# Patient Record
Sex: Female | Born: 1940 | Race: White | Hispanic: No | Marital: Married | Smoking: Never smoker
Health system: Southern US, Community
[De-identification: ages and names within clinical notes are randomized; demographics above are authoritative.]

## PROBLEM LIST (undated history)

## (undated) DIAGNOSIS — I10 Essential (primary) hypertension: Secondary | ICD-10-CM

## (undated) DIAGNOSIS — C801 Malignant (primary) neoplasm, unspecified: Secondary | ICD-10-CM

## (undated) DIAGNOSIS — E119 Type 2 diabetes mellitus without complications: Secondary | ICD-10-CM

---

## 2003-12-06 ENCOUNTER — Other Ambulatory Visit: Payer: Self-pay

## 2004-04-15 ENCOUNTER — Other Ambulatory Visit: Payer: Self-pay

## 2006-08-13 ENCOUNTER — Ambulatory Visit: Payer: Self-pay | Admitting: Obstetrics and Gynecology

## 2006-08-13 ENCOUNTER — Other Ambulatory Visit: Payer: Self-pay

## 2007-09-23 ENCOUNTER — Ambulatory Visit: Payer: Self-pay | Admitting: Obstetrics and Gynecology

## 2007-09-23 ENCOUNTER — Other Ambulatory Visit: Payer: Self-pay

## 2007-09-30 ENCOUNTER — Ambulatory Visit: Payer: Self-pay | Admitting: Obstetrics and Gynecology

## 2009-12-22 ENCOUNTER — Ambulatory Visit: Payer: Self-pay | Admitting: Specialist

## 2010-01-02 ENCOUNTER — Ambulatory Visit: Payer: Self-pay | Admitting: Specialist

## 2014-06-25 ENCOUNTER — Ambulatory Visit: Payer: Self-pay | Admitting: Physician Assistant

## 2014-06-25 ENCOUNTER — Ambulatory Visit: Payer: Self-pay | Admitting: Emergency Medicine

## 2014-06-25 LAB — URINALYSIS, COMPLETE

## 2014-06-26 LAB — URINE CULTURE

## 2014-07-11 ENCOUNTER — Ambulatory Visit: Payer: Self-pay | Admitting: Urology

## 2014-07-25 ENCOUNTER — Ambulatory Visit: Payer: Self-pay | Admitting: Urology

## 2015-07-23 ENCOUNTER — Other Ambulatory Visit: Payer: Self-pay | Admitting: Family Medicine

## 2015-07-23 DIAGNOSIS — Z1231 Encounter for screening mammogram for malignant neoplasm of breast: Secondary | ICD-10-CM

## 2015-08-14 ENCOUNTER — Ambulatory Visit: Payer: Medicare Other | Attending: Family Medicine

## 2016-11-12 ENCOUNTER — Other Ambulatory Visit: Payer: Self-pay | Admitting: Family Medicine

## 2016-11-12 DIAGNOSIS — Z1231 Encounter for screening mammogram for malignant neoplasm of breast: Secondary | ICD-10-CM

## 2016-11-18 ENCOUNTER — Encounter: Payer: Self-pay | Admitting: Radiology

## 2016-11-18 ENCOUNTER — Ambulatory Visit
Admission: RE | Admit: 2016-11-18 | Discharge: 2016-11-18 | Disposition: A | Discharge status: Home / Self Care | Payer: Medicare Other | Source: Ambulatory Visit | Attending: Family Medicine | Admitting: Family Medicine

## 2016-11-18 DIAGNOSIS — Z1231 Encounter for screening mammogram for malignant neoplasm of breast: Secondary | ICD-10-CM | POA: Diagnosis present

## 2016-11-18 HISTORY — DX: Malignant (primary) neoplasm, unspecified: C80.1

## 2019-08-21 ENCOUNTER — Other Ambulatory Visit: Payer: Self-pay

## 2019-08-21 ENCOUNTER — Emergency Department: Payer: Medicare PPO

## 2019-08-21 ENCOUNTER — Encounter: Payer: Self-pay | Admitting: Emergency Medicine

## 2019-08-21 ENCOUNTER — Emergency Department
Admission: EM | Admit: 2019-08-21 | Discharge: 2019-08-21 | Disposition: A | Discharge status: Home / Self Care | Payer: Medicare PPO | Attending: Emergency Medicine | Admitting: Emergency Medicine

## 2019-08-21 DIAGNOSIS — R531 Weakness: Secondary | ICD-10-CM

## 2019-08-21 DIAGNOSIS — U071 COVID-19: Secondary | ICD-10-CM | POA: Insufficient documentation

## 2019-08-21 DIAGNOSIS — Z85828 Personal history of other malignant neoplasm of skin: Secondary | ICD-10-CM | POA: Insufficient documentation

## 2019-08-21 LAB — CBC WITH DIFFERENTIAL/PLATELET
Abs Immature Granulocytes: 0.03 10*3/uL (ref 0.00–0.07)
Basophils Absolute: 0 10*3/uL (ref 0.0–0.1)
Basophils Relative: 0 %
Eosinophils Absolute: 0 10*3/uL (ref 0.0–0.5)
Eosinophils Relative: 1 %
HCT: 39.7 % (ref 36.0–46.0)
Hemoglobin: 12.8 g/dL (ref 12.0–15.0)
Immature Granulocytes: 1 %
Lymphocytes Relative: 16 %
Lymphs Abs: 1 10*3/uL (ref 0.7–4.0)
MCH: 27.9 pg (ref 26.0–34.0)
MCHC: 32.2 g/dL (ref 30.0–36.0)
MCV: 86.5 fL (ref 80.0–100.0)
Monocytes Absolute: 0.4 10*3/uL (ref 0.1–1.0)
Monocytes Relative: 7 %
Neutro Abs: 5 10*3/uL (ref 1.7–7.7)
Neutrophils Relative %: 75 %
Platelets: 185 10*3/uL (ref 150–400)
RBC: 4.59 MIL/uL (ref 3.87–5.11)
RDW: 12.5 % (ref 11.5–15.5)
WBC: 6.5 10*3/uL (ref 4.0–10.5)
nRBC: 0 % (ref 0.0–0.2)

## 2019-08-21 LAB — URINALYSIS, COMPLETE (UACMP) WITH MICROSCOPIC
Bilirubin Urine: NEGATIVE
Glucose, UA: NEGATIVE mg/dL
Hgb urine dipstick: NEGATIVE
Ketones, ur: NEGATIVE mg/dL
Leukocytes,Ua: NEGATIVE
Nitrite: NEGATIVE
Protein, ur: NEGATIVE mg/dL
Specific Gravity, Urine: 1.011 (ref 1.005–1.030)
pH: 5 (ref 5.0–8.0)

## 2019-08-21 LAB — COMPREHENSIVE METABOLIC PANEL
ALT: 23 U/L (ref 0–44)
AST: 33 U/L (ref 15–41)
Albumin: 3.7 g/dL (ref 3.5–5.0)
Alkaline Phosphatase: 58 U/L (ref 38–126)
Anion gap: 11 (ref 5–15)
BUN: 19 mg/dL (ref 8–23)
CO2: 28 mmol/L (ref 22–32)
Calcium: 8.8 mg/dL — ABNORMAL LOW (ref 8.9–10.3)
Chloride: 99 mmol/L (ref 98–111)
Creatinine, Ser: 1.07 mg/dL — ABNORMAL HIGH (ref 0.44–1.00)
GFR calc Af Amer: 58 mL/min — ABNORMAL LOW (ref >60)
GFR calc non Af Amer: 50 mL/min — ABNORMAL LOW (ref >60)
Glucose, Bld: 177 mg/dL — ABNORMAL HIGH (ref 70–99)
Potassium: 3 mmol/L — ABNORMAL LOW (ref 3.5–5.1)
Sodium: 138 mmol/L (ref 135–145)
Total Bilirubin: 0.7 mg/dL (ref 0.3–1.2)
Total Protein: 8.1 g/dL (ref 6.5–8.1)

## 2019-08-21 LAB — LACTIC ACID, PLASMA: Lactic Acid, Venous: 1.8 mmol/L (ref 0.5–1.9)

## 2019-08-21 LAB — TROPONIN I (HIGH SENSITIVITY): Troponin I (High Sensitivity): 6 ng/L (ref <18)

## 2019-08-21 MED ORDER — SODIUM CHLORIDE 0.9 % IV BOLUS
1000.0000 mL | Freq: Once | INTRAVENOUS | Status: AC
  Administered 2019-08-21: 19:00:00 1000 mL via INTRAVENOUS

## 2019-08-21 MED ORDER — POTASSIUM CHLORIDE ER 20 MEQ PO TBCR: 20.0000 meq | EXTENDED_RELEASE_TABLET | Freq: Every day | ORAL | 0 refills | Status: AC

## 2019-08-21 MED ORDER — POTASSIUM CHLORIDE CRYS ER 20 MEQ PO TBCR
40.0000 meq | EXTENDED_RELEASE_TABLET | Freq: Once | ORAL | Status: AC
  Administered 2019-08-21: 21:00:00 40 meq via ORAL
  Filled 2019-08-21: qty 2

## 2019-08-21 NOTE — ED Notes (Signed)
Pt to the er for symptoms r/t her positive covid status. Pt reports weakness, body aches, headache. Pt O2 sats after ambulation 95%.

## 2019-08-21 NOTE — ED Provider Notes (Signed)
[facility] Emergency Department Provider Note  ____________________________________________  Time seen: Approximately 5:49 PM  I have reviewed the triage vital signs and the nursing notes.   HISTORY  Chief Complaint Weakness and COVID positive    HPI Joan Love is a 79 y.o. female who presents the emergency department for evaluation of weakness, nausea, diarrhea in the setting of a positive COVID-19 infection.  Patient states that she first developed symptoms 12 days ago.  Patient was tested for COVID-19 and was positive and received the results 2 days ago.  Patient has felt increasingly fatigued, weak with nausea and diarrhea in the past several days.  Patient had been prescribed Zofran for her nausea which she states has enabled her to keep fluids down but patient states that she has "no energy."  Patient denies any headache, chest pain, frank shortness of breath, abdominal pain.  Patient has a history of hypertension, diabetes.  She states that her blood sugars have been running "slightly high" with the highest being 210 during her infection.         Past Medical History:  Diagnosis Date  . Cancer ([place])    skin ca    There are no problems to display for this patient.   History reviewed. No pertinent surgical history.  Prior to Admission medications   Medication Sig Start Date End Date Taking? Authorizing Provider  potassium chloride 20 MEQ TBCR Take 20 mEq by mouth daily. 08/21/19   Tremayne Sheldon, Charline Bills, PA-C    Allergies Penicillins  Family History  Problem Relation Age of Onset  . Breast cancer Sister 39  . Breast cancer Maternal Aunt 73    Social History Social History   Tobacco Use  . Smoking status: Not on file  Substance Use Topics  . Alcohol use: Not on file  . Drug use: Not on file     Review of Systems  Constitutional: No fever/chills.  Positive for malaise, weakness, positive COVID-19 Eyes: No visual  changes. No discharge ENT: No upper respiratory complaints. Cardiovascular: no chest pain. Respiratory: no cough. No SOB. Gastrointestinal: No abdominal pain.  No nausea, no vomiting.  Positive diarrhea.  No constipation. Genitourinary: Negative for dysuria. No hematuria Musculoskeletal: Negative for musculoskeletal pain. Skin: Negative for rash, abrasions, lacerations, ecchymosis. Neurological: Negative for headaches, focal weakness or numbness. 10-point ROS otherwise negative.  ____________________________________________   PHYSICAL EXAM:  VITAL SIGNS: ED Triage Vitals  Enc Vitals Group     BP 08/21/19 1719 (!) 167/60     Pulse Rate 08/21/19 1719 97     Resp 08/21/19 1719 20     Temp 08/21/19 1719 99.8 F (37.7 C)     Temp Source 08/21/19 1719 Oral     SpO2 08/21/19 1719 97 %     Weight 08/21/19 1720 280 lb (127 kg)     Height 08/21/19 1720 5\' 4"  (1.626 m)     Head Circumference --      Peak Flow --      Pain Score 08/21/19 1719 8     Pain Loc --      Pain Edu? --      Excl. in [place]? --      Constitutional: Alert and oriented. Well appearing and in no acute distress. Eyes: Conjunctivae are normal. PERRL. EOMI. Head: Atraumatic. ENT:      Ears:       Nose: No congestion/rhinnorhea.      Mouth/Throat: Mucous membranes are moist.  Oropharynx with  no gross erythema or edema. Neck: No stridor.  Neck is supple full range of motion Hematological/Lymphatic/Immunilogical: No cervical lymphadenopathy. Cardiovascular: Normal rate, regular rhythm. Normal S1 and S2.  Good peripheral circulation. Respiratory: Normal respiratory effort without tachypnea or retractions. Lungs CTAB. Good air entry to the bases with no decreased or absent breath sounds. Gastrointestinal: Bowel sounds 4 quadrants. Soft and nontender to palpation. No guarding or rigidity. No palpable masses. No distention. No CVA tenderness. Musculoskeletal: Full range of motion to all extremities. No gross deformities  appreciated. Neurologic:  Normal speech and language. No gross focal neurologic deficits are appreciated.  Skin:  Skin is warm, dry and intact. No rash noted. Psychiatric: Mood and affect are normal. Speech and behavior are normal. Patient exhibits appropriate insight and judgement.   ____________________________________________   LABS (all labs ordered are listed, but only abnormal results are displayed)  Labs Reviewed  COMPREHENSIVE METABOLIC PANEL - Abnormal; Notable for the following components:      Result Value   Potassium 3.0 (*)    Glucose, Bld 177 (*)    Creatinine, Ser 1.07 (*)    Calcium 8.8 (*)    GFR calc non Af Amer 50 (*)    GFR calc Af Amer 58 (*)    All other components within normal limits  URINALYSIS, COMPLETE (UACMP) WITH MICROSCOPIC - Abnormal; Notable for the following components:   Color, Urine YELLOW (*)    APPearance CLEAR (*)    Bacteria, UA RARE (*)    All other components within normal limits  CBC WITH DIFFERENTIAL/PLATELET  LACTIC ACID, PLASMA  LACTIC ACID, PLASMA  TROPONIN I (HIGH SENSITIVITY)  TROPONIN I (HIGH SENSITIVITY)   ____________________________________________  EKG   ____________________________________________  RADIOLOGY I personally viewed and evaluated these images as part of my medical decision making, as well as reviewing the written report by the radiologist.  DG Chest 1 View  Result Date: 08/21/2019 CLINICAL DATA:  79 year old female with positive COVID-19. EXAM: CHEST  1 VIEW COMPARISON:  None. FINDINGS: Diffuse bilateral interstitial streaky densities and nodularity may represent atypical pneumonia. Clinical correlation is recommended. There is no focal consolidation, pleural effusion, or pneumothorax. Mild cardiomegaly. No acute osseous pathology. IMPRESSION: Mild interstitial nodularity may represent atypical infection. Clinical correlation is recommended. No focal consolidation. Electronically Signed   By: Anner Crete M.D.   On: 08/21/2019 18:28    ____________________________________________    PROCEDURES  Procedure(s) performed:    Procedures    Medications  sodium chloride 0.9 % bolus 1,000 mL (0 mLs Intravenous Stopped 08/21/19 2130)  potassium chloride SA (KLOR-CON) CR tablet 40 mEq (40 mEq Oral Given 08/21/19 2127)     ____________________________________________   INITIAL IMPRESSION / ASSESSMENT AND PLAN / ED COURSE  Pertinent labs & imaging results that were available during my care of the patient were reviewed by me and considered in my medical decision making (see chart for details).  Review of the [place] CSRS was performed in accordance of the [place] prior to dispensing any controlled drugs.           Patient's diagnosis is consistent with COVID-19, weakness.  Patient presents emergency department with complaint of malaise, weakness in the setting of COVID-19.  Overall exam was reassuring.  Patient's labs are reassuring with a slight fall in her potassium.  Otherwise labs are reassuring.  Patient will be given oral potassium supplementation, as well as prescription for oral potassium for the next 5 days.  Patient is encouraged to drink  plenty of fluids.  She has antiemetics from her primary care for nausea.  At this time I have discussed return precautions with the patient and she verbalizes understanding.  Patient stable for discharge at this time..  Patient is given ED precautions to return to the ED for any worsening or new symptoms.     ____________________________________________  FINAL CLINICAL IMPRESSION(S) / ED DIAGNOSES  Final diagnoses:  [id number]  Weakness      NEW MEDICATIONS STARTED DURING THIS VISIT:  ED Discharge Orders         Ordered    potassium chloride 20 MEQ TBCR  Daily     08/21/19 2112              This chart was dictated using voice recognition software/Dragon. Despite best efforts to proofread, errors can occur which can change  the meaning. Any change was purely unintentional.    Darletta Moll, PA-C 08/21/19 2205    Vanessa Mish[place]ot, MD 08/21/19 2218

## 2019-08-21 NOTE — ED Triage Notes (Signed)
State began feeling sick with chills and fever and body aches and sore throat Jan 1st. States was tested for COVID jan 7th and got positive results Jan 9th. States she feels fatigued. States keeping fluids down.

## 2019-09-11 DIAGNOSIS — U071 COVID-19: Secondary | ICD-10-CM

## 2019-09-11 HISTORY — DX: COVID-19: U07.1

## 2020-05-28 ENCOUNTER — Other Ambulatory Visit: Payer: Self-pay

## 2020-05-28 ENCOUNTER — Encounter: Payer: Self-pay | Admitting: Emergency Medicine

## 2020-05-28 ENCOUNTER — Ambulatory Visit
Admission: EM | Admit: 2020-05-28 | Discharge: 2020-05-28 | Disposition: A | Discharge status: Home / Self Care | Payer: Medicare PPO | Attending: Emergency Medicine | Admitting: Emergency Medicine

## 2020-05-28 DIAGNOSIS — I959 Hypotension, unspecified: Secondary | ICD-10-CM | POA: Insufficient documentation

## 2020-05-28 DIAGNOSIS — E861 Hypovolemia: Secondary | ICD-10-CM | POA: Diagnosis present

## 2020-05-28 DIAGNOSIS — R7989 Other specified abnormal findings of blood chemistry: Secondary | ICD-10-CM | POA: Diagnosis not present

## 2020-05-28 HISTORY — DX: Type 2 diabetes mellitus without complications: E11.9

## 2020-05-28 HISTORY — DX: Essential (primary) hypertension: I10

## 2020-05-28 LAB — BASIC METABOLIC PANEL
Anion gap: 12 (ref 5–15)
BUN: 22 mg/dL (ref 8–23)
CO2: 27 mmol/L (ref 22–32)
Calcium: 9.3 mg/dL (ref 8.9–10.3)
Chloride: 96 mmol/L — ABNORMAL LOW (ref 98–111)
Creatinine, Ser: 1.11 mg/dL — ABNORMAL HIGH (ref 0.44–1.00)
GFR, Estimated: 47 mL/min — ABNORMAL LOW (ref >60)
Glucose, Bld: 146 mg/dL — ABNORMAL HIGH (ref 70–99)
Potassium: 3.7 mmol/L (ref 3.5–5.1)
Sodium: 135 mmol/L (ref 135–145)

## 2020-05-28 NOTE — ED Triage Notes (Signed)
Patient states she went to the dentist this morning and they told her that her BP was 70/50. She states last night she had one episode of vomiting. She states she takes her BP medication in the afternoon.

## 2020-05-28 NOTE — ED Provider Notes (Signed)
HPI  SUBJECTIVE:  Joan Love is a 79 y.o. female who presents with an episode of hypotension measured to be at 70/50 while at the dentist today.  Patient states that she "was not feeling well", feeling lightheaded.  She states it lasted approximately an hour and then resolved on its own.  She denies nausea, diaphoresis, chest pain, shortness of breath, dizziness, presyncope, palpitations, syncope.  No recent change in her medications.  She is eating well. She states that she drinks about maybe 500 cc of water a day.  She has recently gone from drinking sodas and teas to plain water.  States that she does not particularly like plain water, so has been drinking less than usual. She denies vomiting today.  No melena, hematochezia, hematuria, vaginal bleeding, epistaxis.  No urinary complaints, coughing, wheezing.  She has not taken her blood pressure medications yet today.  She has never had symptoms like this before.    She states that she had a similar episode of "feeling bad" last night while trying to take a lukewarm shower, lasted about 20 to 30 minutes.  She states that she felt nauseous and had an episode of vomiting.  It passed on its own.  She did not check her blood pressure while feeling this way last night.  No chest pain, shortness of breath, palpitations, presyncope or syncope with this episode.  She has a blood pressure machine at home, and states that she thinks it is accurate.  She has not checked her blood pressure recently.  She has been in her usual state of health up until today.  She tried lying down and resting with improvement in her symptoms.  No aggravating factors.  She has a past medical history of hypertension taking losartan/hydrochlorothiazide.  States that she has been on this for several years.  She also has a history of diabetes states that her glucose was 161 this morning,  Vertigo.  No history of chronic kidney disease, arrhythmia, hypertension, MI, syncope.   [id number], Lucianne Muss, MD     Past Medical History:  Diagnosis Date  . Cancer ([place])    skin ca  . COVID 09/2019  . Diabetes mellitus without complication ([place])   . Hypertension     History reviewed. No pertinent surgical history.  Family History  Problem Relation Age of Onset  . Breast cancer Sister 85  . Breast cancer Maternal Aunt 73    Social History   Tobacco Use  . Smoking status: Never Smoker  . Smokeless tobacco: Never Used  Vaping Use  . Vaping Use: Never used  Substance Use Topics  . Alcohol use: Never  . Drug use: Never    No current facility-administered medications for this encounter.  Current Outpatient Medications:  .  aspirin 81 MG EC tablet, Take by mouth., Disp: , Rfl:  .  cyanocobalamin 1000 MCG tablet, Take by mouth., Disp: , Rfl:  .  glimepiride (AMARYL) 4 MG tablet, Take 4 mg by mouth 2 (two) times daily., Disp: , Rfl:  .  hydrochlorothiazide (HYDRODIURIL) 25 MG tablet, Take 1 tablet by mouth daily., Disp: , Rfl:  .  insulin NPH-regular Human (NOVOLIN 70/30) (70-30) 100 UNIT/ML injection, Inject into the skin., Disp: , Rfl:  .  latanoprost (XALATAN) 0.005 % ophthalmic solution, Apply to eye., Disp: , Rfl:  .  losartan (COZAAR) 100 MG tablet, Take by mouth., Disp: , Rfl:  .  metFORMIN (GLUCOPHAGE) 1000 MG tablet, Take by mouth., Disp: , Rfl:  .  OZEMPIC, 1 MG/DOSE, 2 MG/1.5ML SOPN, Inject 1 mg into the skin once a week., Disp: , Rfl:  .  potassium chloride 20 MEQ TBCR, Take 20 mEq by mouth daily., Disp: 5 tablet, Rfl: 0 .  pravastatin (PRAVACHOL) 40 MG tablet, Take 1 tablet by mouth at bedtime., Disp: , Rfl:   Allergies  Allergen Reactions  . Penicillins Other (See Comments)    States unsure reaction     ROS  As noted in HPI.   Physical Exam  BP (!) 155/82 (BP Location: Right Arm)   Pulse 85   Temp 98.5 F (36.9 C) (Oral)   Resp 18   Ht 5\' 3"  (1.6 m)   Wt 117 kg   SpO2 100%   BMI 45.70 kg/m   Constitutional: Well developed,  well nourished, no acute distress Eyes: PERRL, EOMI, conjunctiva normal bilaterally HENT: Normocephalic, atraumatic,mucus membranes moist Respiratory: Clear to auscultation bilaterally, no rales, no wheezing, no rhonchi Cardiovascular: Normal rate and rhythm, no murmurs, no gallops, no rubs GI: Soft, nondistended, normal bowel sounds, nontender, no rebound, no guarding Back: no CVAT skin: No rash, skin intact Musculoskeletal: No edema, no tenderness, no deformities Neurologic: Alert & oriented x 3, CN III-XII grossly intact, no motor deficits, sensation grossly intact Psychiatric: Speech and behavior appropriate   ED Course   Medications - No data to display  Orders Placed This Encounter  Procedures  . Basic metabolic panel    Standing Status:   Standing    Number of Occurrences:   1  . ED EKG    Standing Status:   Standing    Number of Occurrences:   1    Order Specific Question:   Reason for Exam    Answer:   Weakness  . EKG 12-Lead    Standing Status:   Standing    Number of Occurrences:   1   Results for orders placed or performed during the hospital encounter of 05/28/20 (from the past 24 hour(s))  Basic metabolic panel     Status: Abnormal   Collection Time: 05/28/20 12:57 PM  Result Value Ref Range   Sodium 135 135 - 145 mmol/L   Potassium 3.7 3.5 - 5.1 mmol/L   Chloride 96 (L) 98 - 111 mmol/L   CO2 27 22 - 32 mmol/L   Glucose, Bld 146 (H) 70 - 99 mg/dL   BUN 22 8 - 23 mg/dL   Creatinine, Ser 1.11 (H) 0.44 - 1.00 mg/dL   Calcium 9.3 8.9 - 10.3 mg/dL   GFR, Estimated 47 (L) >60 mL/min   Anion gap 12 5 - 15   No results found.  ED Clinical Impression  1. Transient hypotension   2. Elevated serum creatinine   3. Hypovolemia      ED Assessment/Plan  EKG: Normal sinus rhythm, rate 72, normal axis, normal intervals. No hypertrophy. No ST-T wave changes. No change compared to EKG from 1/21.  We will check a basic metabolic panel to evaluate renal function.   I suspect that the patient is slightly dehydrated/volume depleted from not drinking enough water.  She states that this change to drinking only water is fairly recent.  Had a long conversation with her encouraging her to drink at least 2 L of nonalcoholic, nonsugary, noncaffeinated beverages a day.  She will check her sugar and blood pressure next time she gets this feeling.  She will need to follow-up with her doctor in several days.  Strict ER return precautions given.  Discussed labs,  MDM, treatment plan, and plan for follow-up with patient Discussed sn/sx that should prompt return to the ED. patient agrees with plan.   No orders of the defined types were placed in this encounter.   *This clinic note was created using Dragon dictation software. Therefore, there may be occasional mistakes despite careful proofreading.  ?    Melynda Ripple, MD 05/28/20 1350

## 2020-05-28 NOTE — Discharge Instructions (Addendum)
You creatinine was a little elevated compared to previous level, but this is most likely from not drinking enough fluids. Push fluids and have it rechecked with your doctor in several weeks.   You need to drink at least 2 L, or for 12 ounce bottles of a nonalcoholic, nonsugary, noncaffeinated beverage per day.  Your urine should be light yellow to clear.  There are a lot of different flavors of herbal tea that will make water more interesting.  Measure your blood pressure and check your sugar next time you start not feeling well.  Follow-up with your doctor in several days.  Go immediately to the ER for the signs and symptoms we discussed.

## 2020-08-04 IMAGING — DX DG CHEST 1V
1 series · 1 of 1 positions shown · non-contrast
Comparison: None.

CLINICAL DATA: 78-year-old female with positive [id number].

EXAM:
CHEST  1 VIEW

[chest ap]
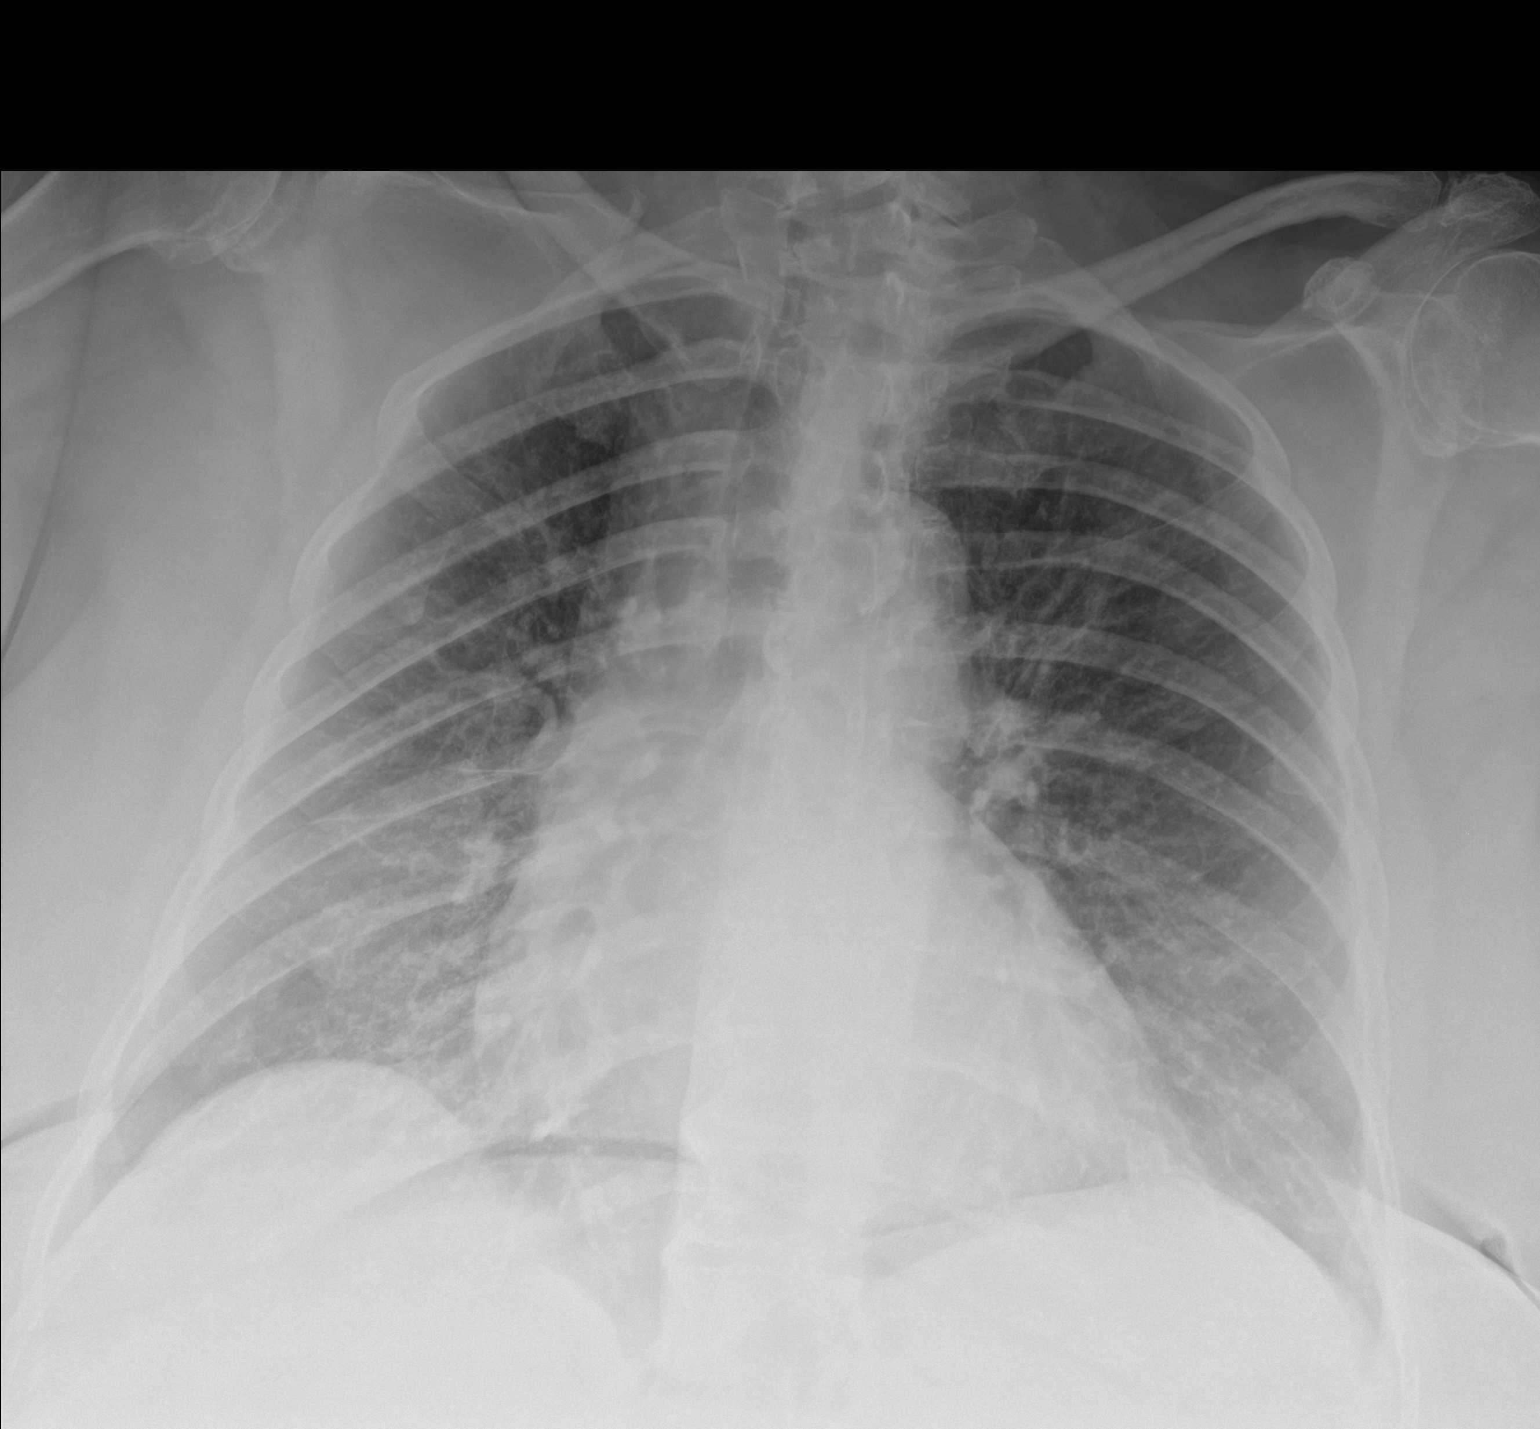

[1 of 1 positions shown; findings below may reference images not displayed]

FINDINGS: Diffuse bilateral interstitial streaky densities and nodularity may
represent atypical pneumonia. Clinical correlation is recommended.
There is no focal consolidation, pleural effusion, or pneumothorax.
Mild cardiomegaly. No acute osseous pathology.
IMPRESSION: Mild interstitial nodularity may represent atypical infection.
Clinical correlation is recommended. No focal consolidation.
